# Patient Record
Sex: Female | Born: 1970 | Race: White | Hispanic: No | Marital: Married
Health system: Southern US, Community
[De-identification: ages and names within clinical notes are randomized; demographics above are authoritative.]

---

## 2013-06-18 ENCOUNTER — Emergency Department: Payer: Self-pay | Admitting: Emergency Medicine

## 2013-06-18 LAB — BASIC METABOLIC PANEL
Anion Gap: 8 (ref 7–16)
BUN: 11 mg/dL (ref 7–18)
Calcium, Total: 8.5 mg/dL (ref 8.5–10.1)
Chloride: 107 mmol/L (ref 98–107)
Co2: 24 mmol/L (ref 21–32)
Creatinine: 0.52 mg/dL — ABNORMAL LOW (ref 0.60–1.30)
EGFR (African American): >60
EGFR (Non-African Amer.): >60
Glucose: 104 mg/dL — ABNORMAL HIGH (ref 65–99)
Osmolality: 277 (ref 275–301)
Potassium: 3.9 mmol/L (ref 3.5–5.1)
Sodium: 139 mmol/L (ref 136–145)

## 2013-06-18 LAB — CBC
HCT: 44.6 % (ref 35.0–47.0)
HGB: 15.3 g/dL (ref 12.0–16.0)
MCH: 31.1 pg (ref 26.0–34.0)
MCHC: 34.2 g/dL (ref 32.0–36.0)
MCV: 91 fL (ref 80–100)
Platelet: 183 10*3/uL (ref 150–440)
RBC: 4.91 10*6/uL (ref 3.80–5.20)
RDW: 14 % (ref 11.5–14.5)
WBC: 11.1 10*3/uL — ABNORMAL HIGH (ref 3.6–11.0)

## 2013-07-31 ENCOUNTER — Other Ambulatory Visit: Payer: Self-pay | Admitting: Internal Medicine

## 2013-07-31 DIAGNOSIS — Z808 Family history of malignant neoplasm of other organs or systems: Secondary | ICD-10-CM

## 2013-08-02 ENCOUNTER — Ambulatory Visit
Admission: RE | Admit: 2013-08-02 | Discharge: 2013-08-02 | Disposition: A | Discharge status: Home / Self Care | Payer: BC Managed Care – PPO | Source: Ambulatory Visit | Attending: Internal Medicine | Admitting: Internal Medicine

## 2013-08-02 DIAGNOSIS — Z808 Family history of malignant neoplasm of other organs or systems: Secondary | ICD-10-CM

## 2014-05-25 IMAGING — CT CT HEAD WITHOUT CONTRAST
2 series · 16 of 30 positions shown, 20 images · non-contrast
Comparison: none

REASON FOR EXAM: fall, large laceration left forehead.
COMMENTS:

[Series 2: soft tissue · axial · 0.46mm/px · z∈[-132,-8]mm · 13 of 31 slices shown, 17 images]
[im 3/31  brain]
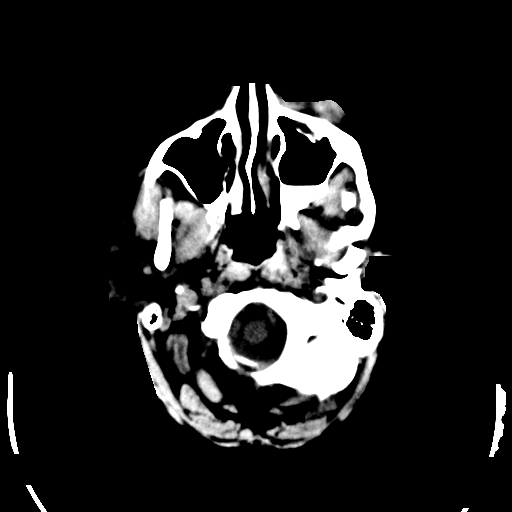
[im 3/31  bone]
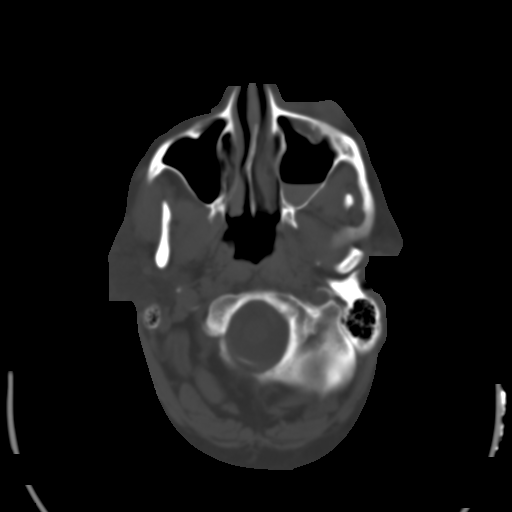
[im 5/31  brain]
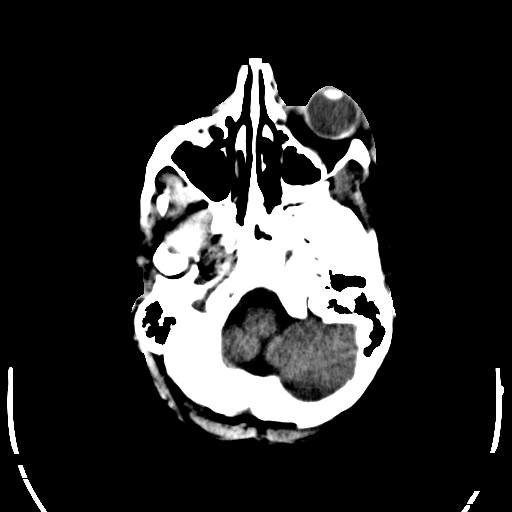
[im 7/31  brain]
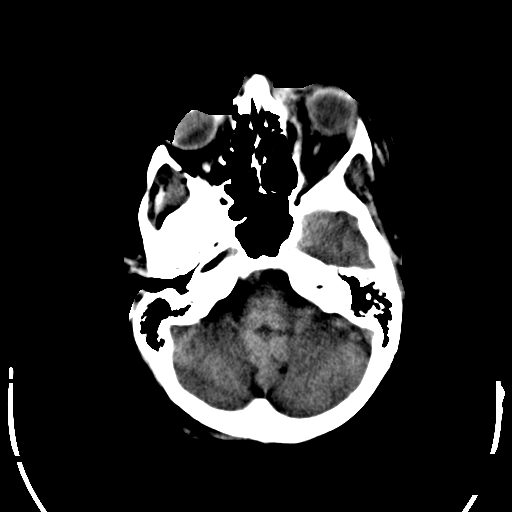
[im 9/31  brain]
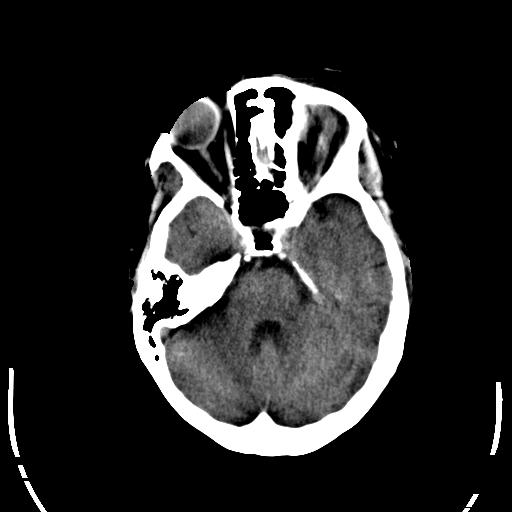
[im 11/31  brain]
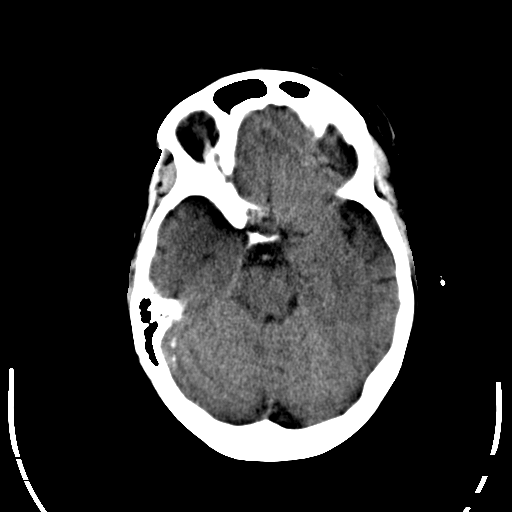
[im 11/31  bone]
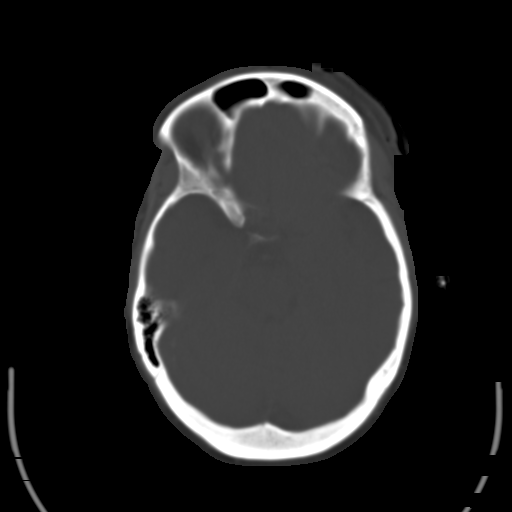
[im 13/31  brain]
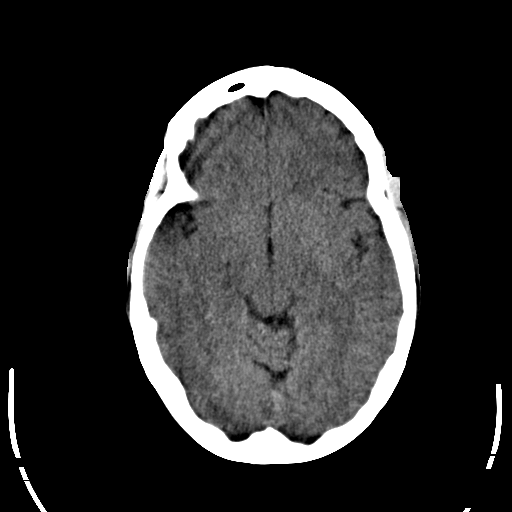
[im 16/31  brain]
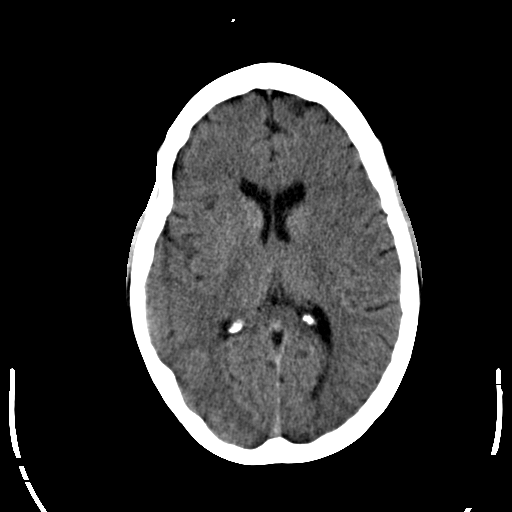
[im 18/31  brain]
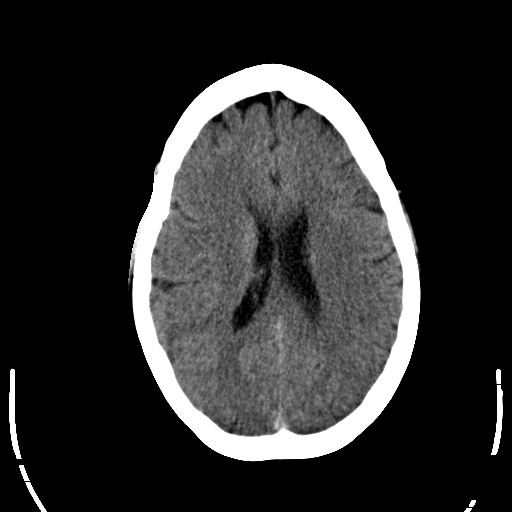
[im 20/31  brain]
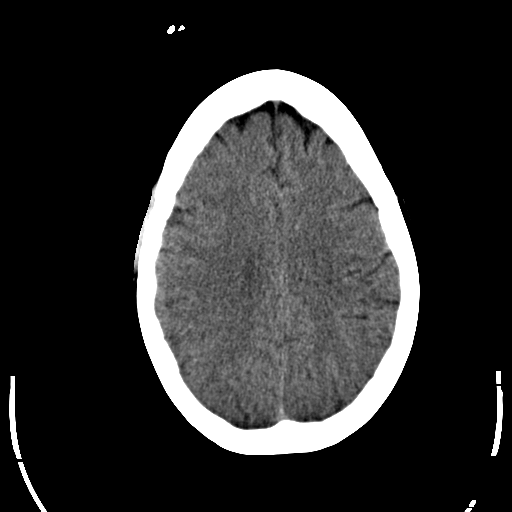
[im 20/31  bone]
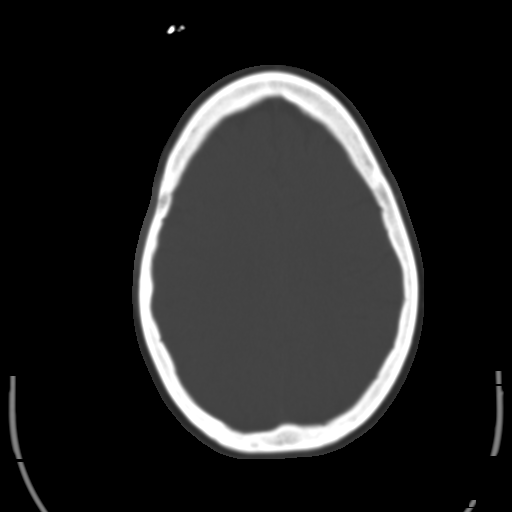
[im 22/31  brain]
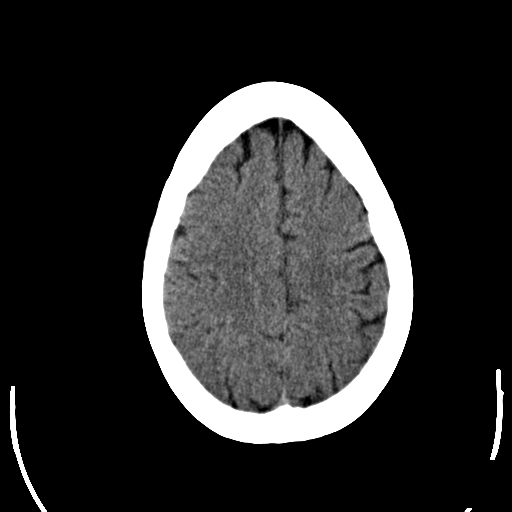
[im 24/31  brain]
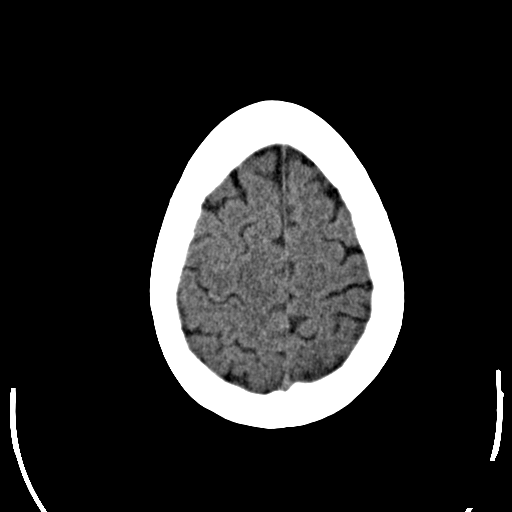
[im 26/31  brain]
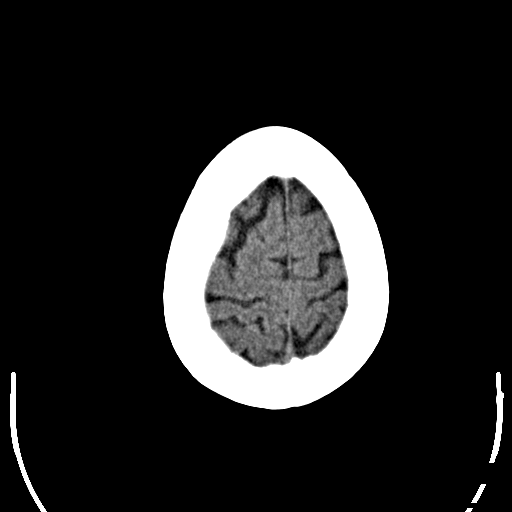
[im 28/31  brain]
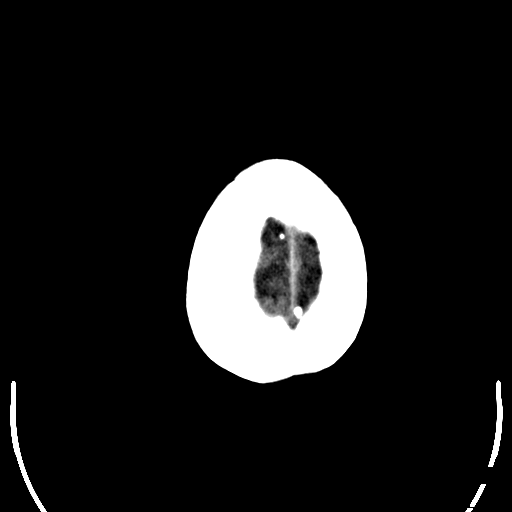
[im 28/31  bone]
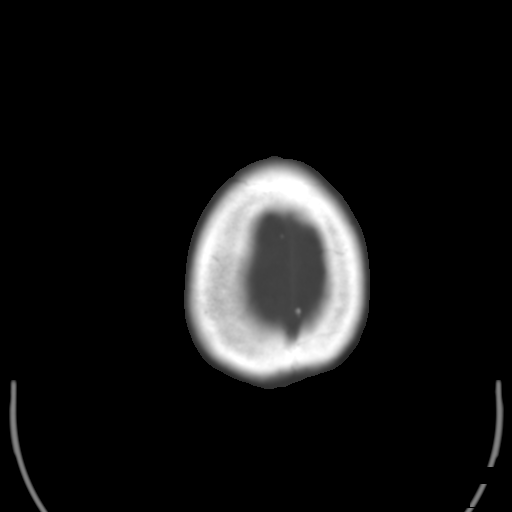

[Series 3: facial 3.0 h60f · axial · 0.34mm/px · z∈[-160,-136]mm · 3 of 30 slices shown]
[im 3/30  brain]
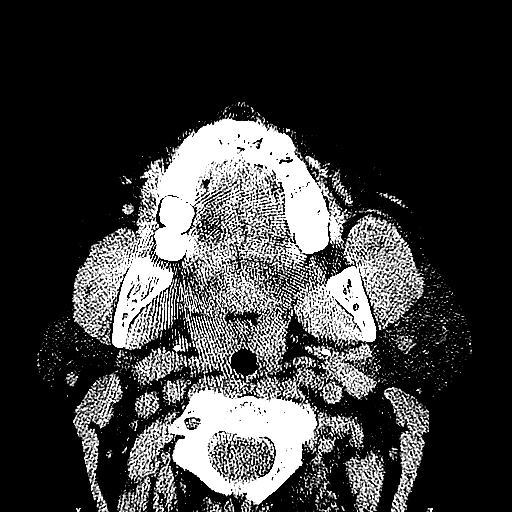
[im 7/30  brain]
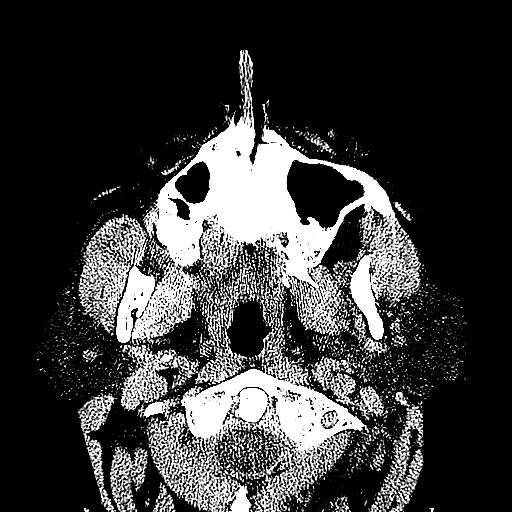
[im 11/30  brain]
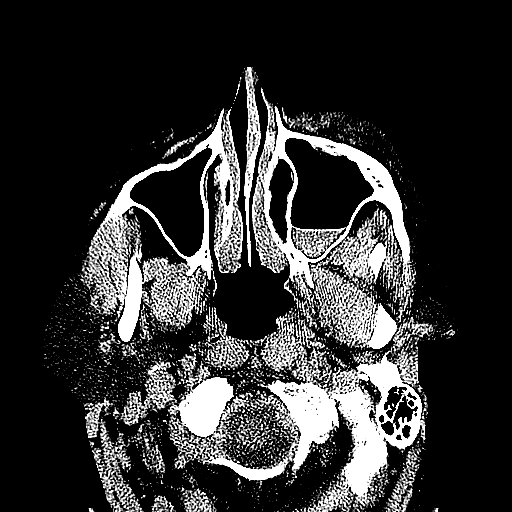

[16 of 30 positions shown; findings below may reference images not displayed]

PROCEDURE:     CT  - CT HEAD WITHOUT CONTRAST  - June 18, 2013 [DATE]

RESULT:     Noncontrast emergent CT of the brain is performed in the
standard fashion. There is no previous examination for comparison.

There is an air-fluid level in the left maxillary sinus with minimal density
in the posterior inferior right maxillary sinus. The sinuses are otherwise
clear. The mastoid air cells show normal appearing aeration. The calvarium
shows no depressed fracture. Is evidence of soft tissue injury in the
supraorbital and periorbital region normal left. This is consistent with
underlying laceration and trauma to the region. Please see the separate
dictation of the orbital CT.
IMPRESSION: 1. No acute intracranial abnormality. Separately dictated orbital CT.

[REDACTED]
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
## 2018-03-29 ENCOUNTER — Other Ambulatory Visit: Payer: Self-pay | Admitting: Endocrinology
